# Patient Record
Sex: Female | Born: 1975 | Race: Black or African American | Hispanic: No | Smoking: Never smoker
Health system: Southern US, Community
[De-identification: ages and names within clinical notes are randomized; demographics above are authoritative.]

---

## 1998-02-07 ENCOUNTER — Inpatient Hospital Stay (HOSPITAL_COMMUNITY): Admission: RE | Admit: 1998-02-07 | Discharge: 1998-02-07 | Payer: Self-pay | Admitting: Obstetrics & Gynecology

## 1998-02-07 ENCOUNTER — Inpatient Hospital Stay (HOSPITAL_COMMUNITY): Admission: AD | Admit: 1998-02-07 | Discharge: 1998-02-07 | Payer: Self-pay

## 1998-03-10 ENCOUNTER — Inpatient Hospital Stay (HOSPITAL_COMMUNITY): Admission: AD | Admit: 1998-03-10 | Discharge: 1998-03-10 | Payer: Self-pay | Admitting: Obstetrics

## 1998-03-13 ENCOUNTER — Ambulatory Visit (HOSPITAL_COMMUNITY): Admission: RE | Admit: 1998-03-13 | Discharge: 1998-03-13 | Payer: Self-pay | Admitting: Obstetrics

## 1998-03-13 ENCOUNTER — Other Ambulatory Visit: Admission: RE | Admit: 1998-03-13 | Discharge: 1998-03-13 | Payer: Self-pay | Admitting: Obstetrics

## 1998-04-09 ENCOUNTER — Inpatient Hospital Stay (HOSPITAL_COMMUNITY): Admission: AD | Admit: 1998-04-09 | Discharge: 1998-04-10 | Payer: Self-pay | Admitting: Obstetrics

## 1998-04-09 ENCOUNTER — Encounter: Payer: Self-pay | Admitting: Obstetrics

## 1998-04-12 ENCOUNTER — Other Ambulatory Visit: Admission: RE | Admit: 1998-04-12 | Discharge: 1998-04-12 | Payer: Self-pay | Admitting: Obstetrics

## 1998-07-31 ENCOUNTER — Inpatient Hospital Stay (HOSPITAL_COMMUNITY): Admission: AD | Admit: 1998-07-31 | Discharge: 1998-07-31 | Payer: Self-pay | Admitting: Obstetrics

## 1998-08-28 ENCOUNTER — Inpatient Hospital Stay (HOSPITAL_COMMUNITY): Admission: AD | Admit: 1998-08-28 | Discharge: 1998-08-28 | Payer: Self-pay | Admitting: Obstetrics

## 1998-09-15 ENCOUNTER — Inpatient Hospital Stay (HOSPITAL_COMMUNITY): Admission: AD | Admit: 1998-09-15 | Discharge: 1998-09-17 | Payer: Self-pay | Admitting: Obstetrics

## 1999-06-11 ENCOUNTER — Other Ambulatory Visit: Admission: RE | Admit: 1999-06-11 | Discharge: 1999-06-11 | Payer: Self-pay | Admitting: Obstetrics and Gynecology

## 1999-07-13 ENCOUNTER — Encounter (INDEPENDENT_AMBULATORY_CARE_PROVIDER_SITE_OTHER): Payer: Self-pay | Admitting: Specialist

## 1999-07-13 ENCOUNTER — Ambulatory Visit (HOSPITAL_COMMUNITY): Admission: RE | Admit: 1999-07-13 | Discharge: 1999-07-13 | Payer: Self-pay | Admitting: Obstetrics and Gynecology

## 1999-08-17 ENCOUNTER — Emergency Department (HOSPITAL_COMMUNITY): Admission: EM | Admit: 1999-08-17 | Discharge: 1999-08-18 | Payer: Self-pay | Admitting: Emergency Medicine

## 1999-08-18 ENCOUNTER — Encounter: Payer: Self-pay | Admitting: Emergency Medicine

## 2000-06-25 ENCOUNTER — Other Ambulatory Visit: Admission: RE | Admit: 2000-06-25 | Discharge: 2000-06-25 | Payer: Self-pay | Admitting: Obstetrics and Gynecology

## 2000-08-12 ENCOUNTER — Encounter (INDEPENDENT_AMBULATORY_CARE_PROVIDER_SITE_OTHER): Payer: Self-pay

## 2000-08-12 ENCOUNTER — Other Ambulatory Visit: Admission: RE | Admit: 2000-08-12 | Discharge: 2000-08-12 | Payer: Self-pay | Admitting: Obstetrics and Gynecology

## 2001-01-09 ENCOUNTER — Other Ambulatory Visit: Admission: RE | Admit: 2001-01-09 | Discharge: 2001-01-09 | Payer: Self-pay | Admitting: Obstetrics and Gynecology

## 2003-06-13 ENCOUNTER — Other Ambulatory Visit: Admission: RE | Admit: 2003-06-13 | Discharge: 2003-06-13 | Payer: Self-pay | Admitting: Obstetrics and Gynecology

## 2004-03-26 ENCOUNTER — Other Ambulatory Visit: Admission: RE | Admit: 2004-03-26 | Discharge: 2004-03-26 | Payer: Self-pay | Admitting: Obstetrics and Gynecology

## 2005-05-27 ENCOUNTER — Other Ambulatory Visit: Admission: RE | Admit: 2005-05-27 | Discharge: 2005-05-27 | Payer: Self-pay | Admitting: Obstetrics and Gynecology

## 2006-09-05 ENCOUNTER — Ambulatory Visit (HOSPITAL_COMMUNITY): Admission: RE | Admit: 2006-09-05 | Discharge: 2006-09-05 | Payer: Self-pay | Admitting: Chiropractic Medicine

## 2008-12-05 ENCOUNTER — Encounter: Admission: RE | Admit: 2008-12-05 | Discharge: 2008-12-05 | Payer: Self-pay | Admitting: Family Medicine

## 2010-06-08 NOTE — Op Note (Signed)
[facility]  Patient:    [name], [name]                     MRN: [id number] Proc. Date: 07/13/99 Adm. Date:  [id number] Disc. Date: [id number] Attending:  Michaele Offer                           Operative Report  PREOPERATIVE DIAGNOSIS:  Lesion on the mons pubis.  POSTOPERATIVE DIAGNOSIS:  Lesion on the mons pubis.  PROCEDURE:  Removal of the lesion.  SURGEON:  Lavina Hamman, M.D.  ANESTHESIA:  Local with 8 cc of 0.25% Marcaine with epinephrine.  DESCRIPTION OF PROCEDURE:  After appropriate informed consent was obtained, the patient was taken to the minor procedure room and placed in the dorsal supine position. The area around this 2 cm polypoid lesion was prepped with Betadine. The skin beneath the lesion was then infiltrated with 0.25% Marcaine with epinephrine. Anesthesia was found to be adequate and the lesion was removed sharply with a knife. The small skin defect was reapproximated with two figure-of-eight sutures of 3-0 Vicryl to achieve adequate hemostasis. The patient tolerated the procedure well and was sent home in stable condition. DD: 07/13/99 TD:  07/16/99 Job: [id number]

## 2012-07-31 ENCOUNTER — Ambulatory Visit
Admission: RE | Admit: 2012-07-31 | Discharge: 2012-07-31 | Disposition: A | Discharge status: Home / Self Care | Payer: BC Managed Care – PPO | Source: Ambulatory Visit | Attending: Family Medicine | Admitting: Family Medicine

## 2012-07-31 ENCOUNTER — Other Ambulatory Visit: Payer: Self-pay | Admitting: Family Medicine

## 2012-07-31 DIAGNOSIS — M549 Dorsalgia, unspecified: Secondary | ICD-10-CM

## 2012-07-31 DIAGNOSIS — M25512 Pain in left shoulder: Secondary | ICD-10-CM

## 2012-07-31 DIAGNOSIS — M25552 Pain in left hip: Secondary | ICD-10-CM

## 2015-12-04 ENCOUNTER — Ambulatory Visit: Payer: BC Managed Care – PPO | Admitting: Nurse Practitioner

## 2017-12-16 ENCOUNTER — Encounter (INDEPENDENT_AMBULATORY_CARE_PROVIDER_SITE_OTHER): Payer: Self-pay | Admitting: Orthopaedic Surgery

## 2017-12-16 ENCOUNTER — Ambulatory Visit (INDEPENDENT_AMBULATORY_CARE_PROVIDER_SITE_OTHER): Payer: Worker's Compensation | Admitting: Orthopaedic Surgery

## 2017-12-16 VITALS — BP 132/83 | HR 98 | Ht 66.0 in | Wt 157.0 lb

## 2017-12-16 DIAGNOSIS — S20222A Contusion of left back wall of thorax, initial encounter: Secondary | ICD-10-CM | POA: Insufficient documentation

## 2017-12-16 NOTE — Progress Notes (Signed)
Office Visit Note/orthopedic consultation requested by Dr. Azucena CecilSwayne   Patient: Samantha Weber           Date of Birth: 10-28-1975           MRN: [id number] Visit Date: 12/16/2017              Requested by: No referring provider defined for this encounter. PCP: Patient, No Pcp Per   Assessment & Plan: Visit Diagnoses:  1. Contusion of left back wall of thorax, initial encounter     Plan: I reviewed the MRI scan I gave her a copy of the report.  We will outline a gradual ramp up program having her do 1 hour regular duty in 3 hours of light duty and then each subsequent 2 weeks increase her regular duty by 1 hour.  I plan to recheck her in 2 months.  Thank you for the opportunity to see her in consultation  Follow-Up Instructions: Return in about 2 months (around 02/15/2018).   Orders:  No orders of the defined types were placed in this encounter.  No orders of the defined types were placed in this encounter.     Procedures: No procedures performed   Clinical Data: No additional findings.   Subjective: Chief Complaint  Patient presents with  . Spine - Pain    OTJI 08/25/17    HPI orthopedic consultation for an on-the-job injury that occurred on 08/25/2017 requested by Dr. Azucena CecilSwayne, [facility] [place] [facility] 42 year old female works at [employer] and a 70 pound headboard fell off a conveyor belt above her and landed on her back in the mid thoracic region.  She is had an MRI scan which is available for review both thoracic spine and also lumbar spine.  She tried doing her regular job after being on modified light duty and states after about an hour and a half she had some increased pain.  She does some turning and twisting when she works but normally does not do heavy lifting repetitively.  She states the lifting limit for her is 50 pounds.  She been on some muscle relaxants.  [facility] and are available for review.  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  Review of Systems patient review of systems positive for acid reflux she is on no other medication she is been active healthy.  She has a 43 year old child is healthy.  14 point review of systems otherwise negative   Objective: Vital Signs: BP 132/83   Pulse 98   Ht 5\' 6"  (1.676 m)   Wt 157 lb (71.2 kg)   BMI 25.34 kg/m   Physical Exam  Constitutional: She is oriented to person, place, and time. She appears well-developed.  HENT:  Head: Normocephalic.  Right Ear: External ear normal.  Left Ear: External ear normal.  Eyes: Pupils are equal, round, and reactive to light.  Neck: No tracheal deviation present. No thyromegaly present.  Cardiovascular: Normal rate.  Pulmonary/Chest: Effort normal.  Abdominal: Soft.  Neurological: She is alert and oriented to person, place, and time.  Skin: Skin is warm and dry.  Psychiatric: She has a normal mood and affect. Her behavior is normal.    Ortho Exam patient has an intact strength good forward flexion some discomfort with extension and twisting.  Some tenderness mid to upper thoracic more left parathoracic.  No  winging of the scapula.  Good lower extremity strength.  She can heel toe walk easily.  Specialty Comments:  No specialty comments available.  Imaging: MRI scans were reviewed with the CD disc thoracic and lumbar MRI reviewed these with the patient these are entirely normal without evidence of acute injury normal disc hydration good position and alignment.   PMFS History: There are no active problems to display for this patient.  History reviewed. No pertinent past medical history.  No family history on file.  History reviewed. No pertinent surgical history. Social History   Occupational History  . Not on file  Tobacco Use  . Smoking status: Never Smoker  . Smokeless tobacco: Never Used  Substance and  Sexual Activity  . Alcohol use: Yes    Comment: occasionally  . Drug use: Not on file  . Sexual activity: Not on file

## 2017-12-22 ENCOUNTER — Telehealth (INDEPENDENT_AMBULATORY_CARE_PROVIDER_SITE_OTHER): Payer: Self-pay | Admitting: Orthopaedic Surgery

## 2017-12-22 NOTE — Telephone Encounter (Signed)
[facility]16/[facility] [phone]

## 2017-12-23 ENCOUNTER — Telehealth (INDEPENDENT_AMBULATORY_CARE_PROVIDER_SITE_OTHER): Payer: Self-pay

## 2017-12-23 NOTE — Telephone Encounter (Signed)
Faxed 12/16/17 office and work note to [employer] @ [place] [phone]

## 2017-12-23 NOTE — Telephone Encounter (Signed)
-----   Message from Eldred MangesMark C Yates, MD sent at 12/16/2017  3:03 PM EST ----- Cc wc And cc  Of Consult to Dr. Azucena CecilSwayne thanks

## 2018-02-17 ENCOUNTER — Encounter (INDEPENDENT_AMBULATORY_CARE_PROVIDER_SITE_OTHER): Payer: Self-pay | Admitting: Orthopaedic Surgery

## 2018-02-17 ENCOUNTER — Ambulatory Visit (INDEPENDENT_AMBULATORY_CARE_PROVIDER_SITE_OTHER): Payer: Worker's Compensation | Admitting: Orthopaedic Surgery

## 2018-02-17 VITALS — BP 116/75 | HR 81 | Ht 66.0 in | Wt 150.0 lb

## 2018-02-17 DIAGNOSIS — S20222D Contusion of left back wall of thorax, subsequent encounter: Secondary | ICD-10-CM

## 2018-02-18 ENCOUNTER — Telehealth (INDEPENDENT_AMBULATORY_CARE_PROVIDER_SITE_OTHER): Payer: Self-pay

## 2018-02-18 ENCOUNTER — Encounter (INDEPENDENT_AMBULATORY_CARE_PROVIDER_SITE_OTHER): Payer: Self-pay | Admitting: Orthopaedic Surgery

## 2018-02-18 NOTE — Telephone Encounter (Signed)
Faxed office note to [facility] [phone]

## 2018-02-18 NOTE — Progress Notes (Signed)
Office Visit Note   Patient: Samantha Weber           Date of Birth: 1975-03-28           MRN: [id number] Visit Date: 02/17/2018              Requested by: No referring provider defined for this encounter. PCP: Patient, No Pcp Per   Assessment & Plan: Visit Diagnoses:  1. Contusion of left back wall of thorax, subsequent encounter     Plan: Patient's had MRI scan thoracic and lumbar which are normal.  She is having problems sleeping and we will restart some Flexeril 10 mg at night.  We discussed the try cyclic effect of Flexeril which may help her rest as well as improve her pain symptoms.  I plan to recheck her in 2 months.  Follow-Up Instructions: No follow-ups on file.   Orders:  No orders of the defined types were placed in this encounter.  No orders of the defined types were placed in this encounter.     Procedures: No procedures performed   Clinical Data: No additional findings.   Subjective: Chief Complaint  Patient presents with  . Lower Back - Pain, Follow-up    OTJI 08/25/17  . Middle Back - Pain, Follow-up    HPI 43 year old female seen for follow-up with on-the-job injury from 08/25/2017 with continued symptoms in the thoracic region between the scapula and at the thoracolumbar junction.  She states she has discomfort when she sits back against a chair.  She denies any lower extremity numbness or tingling she is taken ibuprofen without relief.  She is having problems sleeping.  She works at [employer] 4 hours part-time work and also is a Merchandiser, retail.  No fever chills.  No numbness or tingling in her hands.  Review of Systems reviewed updated unchanged from 12/16/2017 note is a pertains HPI 14 point update.   Objective: Vital Signs: BP 116/75   Pulse 81   Ht 5\' 6"  (1.676 m)   Wt 150 lb (68 kg)   BMI 24.21 kg/m   Physical Exam Constitutional:      Appearance: She is well-developed.  HENT:     Head: Normocephalic.     Right Ear:  External ear normal.     Left Ear: External ear normal.  Eyes:     Pupils: Pupils are equal, round, and reactive to light.  Neck:     Thyroid: No thyromegaly.     Trachea: No tracheal deviation.  Cardiovascular:     Rate and Rhythm: Normal rate.  Pulmonary:     Effort: Pulmonary effort is normal.  Abdominal:     Palpations: Abdomen is soft.  Skin:    General: Skin is warm and dry.  Neurological:     Mental Status: She is alert and oriented to person, place, and time.  Psychiatric:        Behavior: Behavior normal.     Ortho Exam patient has normal heel toe gait.  Some tenderness around the upper thoracic paraspinal muscles.  No scapular winging.  No rash over exposed skin section the hands are intact.  She is able heel and toe walk.  Specialty Comments:  No specialty comments available.  Imaging: No results found.   PMFS History: Patient Active Problem List   Diagnosis Date Noted  . Contusion of left back wall of thorax 12/16/2017   No past medical history on file.  No family history on file.  No past surgical history on file. Social History   Occupational History  . Not on file  Tobacco Use  . Smoking status: Never Smoker  . Smokeless tobacco: Never Used  Substance and Sexual Activity  . Alcohol use: Yes    Comment: occasionally  . Drug use: Not on file  . Sexual activity: Not on file

## 2018-02-18 NOTE — Telephone Encounter (Signed)
-----   Message from Eldred Manges, MD sent at 02/18/2018  8:11 AM EST ----- W/c thx

## 2018-11-24 ENCOUNTER — Other Ambulatory Visit (INDEPENDENT_AMBULATORY_CARE_PROVIDER_SITE_OTHER): Payer: Self-pay | Admitting: Orthopaedic Surgery

## 2018-11-24 NOTE — Telephone Encounter (Signed)
Refill request received on 11/04/2018 but not linked to correct patient. Patient has not been seen in the office since January of this year. Would you like for me to make return office appt prior to refill?

## 2018-11-24 NOTE — Telephone Encounter (Signed)
Yes ROV thanks

## 2018-12-29 ENCOUNTER — Other Ambulatory Visit: Payer: Self-pay

## 2018-12-29 ENCOUNTER — Other Ambulatory Visit: Payer: Self-pay | Admitting: Family Medicine

## 2018-12-29 ENCOUNTER — Ambulatory Visit: Payer: Self-pay

## 2018-12-29 DIAGNOSIS — M79671 Pain in right foot: Secondary | ICD-10-CM

## 2019-03-09 ENCOUNTER — Emergency Department (HOSPITAL_COMMUNITY): Payer: BC Managed Care – PPO

## 2019-03-09 ENCOUNTER — Emergency Department (HOSPITAL_COMMUNITY)
Admission: EM | Admit: 2019-03-09 | Discharge: 2019-03-09 | Disposition: A | Discharge status: Home / Self Care | Payer: BC Managed Care – PPO | Attending: Emergency Medicine | Admitting: Emergency Medicine

## 2019-03-09 ENCOUNTER — Other Ambulatory Visit: Payer: Self-pay

## 2019-03-09 ENCOUNTER — Encounter (HOSPITAL_COMMUNITY): Payer: Self-pay | Admitting: Emergency Medicine

## 2019-03-09 DIAGNOSIS — Z79899 Other long term (current) drug therapy: Secondary | ICD-10-CM | POA: Diagnosis not present

## 2019-03-09 DIAGNOSIS — Z20822 Contact with and (suspected) exposure to covid-19: Secondary | ICD-10-CM | POA: Insufficient documentation

## 2019-03-09 DIAGNOSIS — R55 Syncope and collapse: Secondary | ICD-10-CM | POA: Diagnosis present

## 2019-03-09 DIAGNOSIS — R1031 Right lower quadrant pain: Secondary | ICD-10-CM | POA: Insufficient documentation

## 2019-03-09 DIAGNOSIS — B349 Viral infection, unspecified: Secondary | ICD-10-CM

## 2019-03-09 LAB — COMPREHENSIVE METABOLIC PANEL
ALT: 19 U/L (ref 0–44)
AST: 19 U/L (ref 15–41)
Albumin: 4.3 g/dL (ref 3.5–5.0)
Alkaline Phosphatase: 67 U/L (ref 38–126)
Anion gap: 12 (ref 5–15)
BUN: 13 mg/dL (ref 6–20)
CO2: 25 mmol/L (ref 22–32)
Calcium: 9.5 mg/dL (ref 8.9–10.3)
Chloride: 104 mmol/L (ref 98–111)
Creatinine, Ser: 0.96 mg/dL (ref 0.44–1.00)
GFR calc Af Amer: >60 mL/min (ref >60)
GFR calc non Af Amer: >60 mL/min (ref >60)
Glucose, Bld: 102 mg/dL — ABNORMAL HIGH (ref 70–99)
Potassium: 4 mmol/L (ref 3.5–5.1)
Sodium: 141 mmol/L (ref 135–145)
Total Bilirubin: 1.4 mg/dL — ABNORMAL HIGH (ref 0.3–1.2)
Total Protein: 7.9 g/dL (ref 6.5–8.1)

## 2019-03-09 LAB — URINALYSIS, ROUTINE W REFLEX MICROSCOPIC
Bilirubin Urine: NEGATIVE
Glucose, UA: NEGATIVE mg/dL
Hgb urine dipstick: NEGATIVE
Ketones, ur: 20 mg/dL — AB
Nitrite: NEGATIVE
Protein, ur: NEGATIVE mg/dL
Specific Gravity, Urine: 1.015 (ref 1.005–1.030)
pH: 7 (ref 5.0–8.0)

## 2019-03-09 LAB — CBC WITH DIFFERENTIAL/PLATELET
Abs Immature Granulocytes: 0.01 10*3/uL (ref 0.00–0.07)
Basophils Absolute: 0 10*3/uL (ref 0.0–0.1)
Basophils Relative: 1 %
Eosinophils Absolute: 0 10*3/uL (ref 0.0–0.5)
Eosinophils Relative: 0 %
HCT: 41.3 % (ref 36.0–46.0)
Hemoglobin: 14.5 g/dL (ref 12.0–15.0)
Immature Granulocytes: 0 %
Lymphocytes Relative: 31 %
Lymphs Abs: 2.1 10*3/uL (ref 0.7–4.0)
MCH: 32.7 pg (ref 26.0–34.0)
MCHC: 35.1 g/dL (ref 30.0–36.0)
MCV: 93 fL (ref 80.0–100.0)
Monocytes Absolute: 0.6 10*3/uL (ref 0.1–1.0)
Monocytes Relative: 9 %
Neutro Abs: 4.1 10*3/uL (ref 1.7–7.7)
Neutrophils Relative %: 59 %
Platelets: 361 10*3/uL (ref 150–400)
RBC: 4.44 MIL/uL (ref 3.87–5.11)
RDW: 12.5 % (ref 11.5–15.5)
WBC: 6.9 10*3/uL (ref 4.0–10.5)
nRBC: 0 % (ref 0.0–0.2)

## 2019-03-09 LAB — SARS CORONAVIRUS 2 BY RT PCR (DIASORIN): SARS Coronavirus 2: NEGATIVE

## 2019-03-09 LAB — PREGNANCY, URINE: Preg Test, Ur: NEGATIVE

## 2019-03-09 LAB — CBG MONITORING, ED: Glucose-Capillary: 94 mg/dL (ref 70–99)

## 2019-03-09 LAB — LIPASE, BLOOD: Lipase: 27 U/L (ref 11–51)

## 2019-03-09 LAB — POC SARS CORONAVIRUS 2 AG -  ED: SARS Coronavirus 2 Ag: NEGATIVE

## 2019-03-09 MED ORDER — SODIUM CHLORIDE 0.9 % IV BOLUS
1000.0000 mL | Freq: Once | INTRAVENOUS | Status: AC
  Administered 2019-03-09: 1000 mL via INTRAVENOUS

## 2019-03-09 MED ORDER — IOHEXOL 350 MG/ML SOLN
100.0000 mL | Freq: Once | INTRAVENOUS | Status: AC | PRN
  Administered 2019-03-09: 13:00:00 100 mL via INTRAVENOUS

## 2019-03-09 MED ORDER — ONDANSETRON 4 MG PO TBDP: ORAL_TABLET | ORAL | 0 refills | Status: AC

## 2019-03-09 NOTE — ED Notes (Signed)
Pt ambulated to bathroom, no assistance needed, just walked with the pt. Steady gait.

## 2019-03-09 NOTE — ED Provider Notes (Signed)
[facility] EMERGENCY DEPARTMENT Provider Note   CSN: [id number] Arrival date & time: 03/09/19  2876     History No chief complaint on file.   Samantha Weber is a 44 y.o. female.  Patient is a 44 year old female who presents after syncopal event.  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  She fell backwards striking her head.  She complains of a significant posterior headache.  She says she has not felt well in the last couple days.  She has had a headache off and on for about a week which she describes as bifrontal type headache.  She has had a little bit of rhinorrhea.  She says that 2 days ago she started having some watery diarrhea which is continued.  She has had few episodes of associated vomiting associated with nausea.  She has some pain in her abdomen, more in the right lower abdomen.  She denies any known fevers.  No cough or chest congestion.  No urinary symptoms.  She denies any injuries from the fall other than the headache.  She denies any neck or back pain.  No numbness or weakness to her extremities other than her hands a felt tingly since she has had all the diarrhea and vomiting.        History reviewed. No pertinent past medical history.  Patient Active Problem List   Diagnosis Date Noted  . Contusion of left back wall of thorax 12/16/2017    History reviewed. No pertinent surgical history.   OB History   No obstetric history on file.     No family history on file.  Social History   Tobacco Use  . Smoking status: Never Smoker  . Smokeless tobacco: Never Used  Substance Use Topics  . Alcohol use: Yes    Comment: occasionally  . Drug use: Not on file    Home Medications Prior to Admission medications   Medication Sig Start Date End Date Taking? Authorizing Provider  medroxyPROGESTERone Acetate 150 MG/ML SUSY Inject 1 mL into the muscle  every 3 (three) months. 02/08/19  Yes [provider]  zolpidem (AMBIEN) 10 MG tablet Take 10 mg by mouth daily as needed for sleep. 02/25/19  Yes [provider]  metroNIDAZOLE (FLAGYL) 500 MG tablet Take 500 mg by mouth 2 (two) times daily. 03/05/19   [provider]  ondansetron (ZOFRAN ODT) 4 MG disintegrating tablet 4mg  ODT q4 hours prn nausea/vomit 03/09/19   03/11/19, MD    Allergies    Erythromycin base  Review of Systems   Review of Systems  Constitutional: Positive for fatigue. Negative for chills, diaphoresis and fever.  HENT: Positive for congestion and rhinorrhea. Negative for sneezing.   Eyes: Negative.   Respiratory: Negative for cough, chest tightness and shortness of breath.   Cardiovascular: Negative for chest pain and leg swelling.  Gastrointestinal: Positive for diarrhea, nausea and vomiting. Negative for abdominal pain and blood in stool.  Genitourinary: Negative for difficulty urinating, flank pain, frequency and hematuria.  Musculoskeletal: Negative for arthralgias and back pain.  Skin: Negative for rash.  Neurological: Positive for syncope, light-headedness and headaches. Negative for speech difficulty, weakness and numbness.    Physical Exam Updated Vital Signs BP 116/76   Pulse 88   Temp 98.7 F (37.1 C) (Oral)   Resp 11   Ht 5' 6.5" (1.689 m)   Wt 71.2  kg   SpO2 100%   BMI 24.96 kg/m   Physical Exam Constitutional:      Appearance: She is well-developed.  HENT:     Head: Normocephalic and atraumatic.  Eyes:     Pupils: Pupils are equal, round, and reactive to light.  Neck:     Comments: No pain along the cervical, thoracic or lumbosacral spine Cardiovascular:     Rate and Rhythm: Normal rate and regular rhythm.     Heart sounds: Normal heart sounds.  Pulmonary:     Effort: Pulmonary effort is normal. No respiratory distress.     Breath sounds: Normal breath sounds. No wheezing or rales.  Chest:     Chest wall:  No tenderness.  Abdominal:     General: Bowel sounds are normal.     Palpations: Abdomen is soft.     Tenderness: There is abdominal tenderness (RLQ, right flank). There is no guarding or rebound.  Musculoskeletal:        General: Normal range of motion.     Cervical back: Normal range of motion and neck supple.  Lymphadenopathy:     Cervical: No cervical adenopathy.  Skin:    General: Skin is warm and dry.     Findings: No rash.  Neurological:     General: No focal deficit present.     Mental Status: She is alert and oriented to person, place, and time.     ED Results / Procedures / Treatments   Labs (all labs ordered are listed, but only abnormal results are displayed) Labs Reviewed  COMPREHENSIVE METABOLIC PANEL - Abnormal; Notable for the following components:      Result Value   Glucose, Bld 102 (*)    Total Bilirubin 1.4 (*)    All other components within normal limits  URINALYSIS, ROUTINE W REFLEX MICROSCOPIC - Abnormal; Notable for the following components:   Ketones, ur 20 (*)    Leukocytes,Ua SMALL (*)    Bacteria, UA FEW (*)    All other components within normal limits  SARS CORONAVIRUS 2 BY RT PCR (DIASORIN)  LIPASE, BLOOD  CBC WITH DIFFERENTIAL/PLATELET  PREGNANCY, URINE  CBG MONITORING, ED  POC SARS CORONAVIRUS 2 AG -  ED    EKG EKG Interpretation  Date/Time:  Tuesday March 09 2019 08:10:21 EST Ventricular Rate:  81 PR Interval:    QRS Duration: 71 QT Interval:  354 QTC Calculation: 411 R Axis:   61 Text Interpretation: Sinus rhythm No old tracing to compare Confirmed by Rolan Bucco [phone]) on 03/09/2019 8:16:32 AM   Radiology CT Head Wo Contrast  Result Date: 03/09/2019 CLINICAL DATA:  Head trauma, unwitnessed syncopal episode EXAM: CT HEAD WITHOUT CONTRAST TECHNIQUE: Contiguous axial images were obtained from the base of the skull through the vertex without intravenous contrast. COMPARISON:  None. FINDINGS: Brain: No evidence of acute  infarction, hemorrhage, hydrocephalus, extra-axial collection or mass lesion/mass effect. Vascular: No hyperdense vessel or unexpected calcification. Skull: No osseous abnormality. Sinuses/Orbits: Visualized paranasal sinuses are clear. Visualized mastoid sinuses are clear. Visualized orbits demonstrate no focal abnormality. Other: None IMPRESSION: No acute intracranial pathology. Electronically Signed   By: Elige Ko   On: 03/09/2019 12:39   CT Abdomen Pelvis W Contrast  Addendum Date: 03/09/2019   ADDENDUM REPORT: 03/09/2019 12:54 ADDENDUM: This addendum is given for the purpose of noting the patient has a small hiatal hernia. Electronically Signed   By: Drusilla Kanner M.D.   On: 03/09/2019 12:54   Result Date:  03/09/2019 CLINICAL DATA:  Possible syncopal episode today. Abdominal pain and vomiting for the past 2-3 days. EXAM: CT ABDOMEN AND PELVIS WITH CONTRAST TECHNIQUE: Multidetector CT imaging of the abdomen and pelvis was performed using the standard protocol following bolus administration of intravenous contrast. CONTRAST:  1101mL OMNIPAQUE IOHEXOL 350 MG/ML SOLN COMPARISON:  None. FINDINGS: Lower chest: Mild dependent atelectasis in the lung bases. No pleural or pericardial effusion. Hepatobiliary: No focal liver abnormality is seen. No gallstones, gallbladder wall thickening, or biliary dilatation. Pancreas: Unremarkable. No pancreatic ductal dilatation or surrounding inflammatory changes. Spleen: Normal in size without focal abnormality. Adrenals/Urinary Tract: Adrenal glands are unremarkable. Kidneys are normal, without renal calculi, focal lesion, or hydronephrosis. Bladder is unremarkable. Stomach/Bowel: Stomach is within normal limits. Appendix appears normal. No evidence of bowel wall thickening, distention, or inflammatory changes. Vascular/Lymphatic: No significant vascular findings are present. No enlarged abdominal or pelvic lymph nodes. Reproductive: Uterus and bilateral adnexa are  unremarkable. Other: None. Musculoskeletal: Negative. IMPRESSION: Negative CT abdomen and pelvis. Electronically Signed: By: Inge Rise M.D. On: 03/09/2019 12:42    Procedures Procedures (including critical care time)  Medications Ordered in ED Medications  sodium chloride 0.9 % bolus 1,000 mL (0 mLs Intravenous Stopped 03/09/19 1247)  iohexol (OMNIPAQUE) 350 MG/ML injection 100 mL (100 mLs Intravenous Contrast Given 03/09/19 1231)    ED Course  I have reviewed the triage vital signs and the nursing notes.  Pertinent labs & imaging results that were available during my care of the patient were reviewed by me and considered in my medical decision making (see chart for details).    MDM Rules/Calculators/A&P                     Patient is a 44 year old female who presents after a syncopal episode.  She has had viral-like symptoms with diarrhea nausea vomiting and some rhinorrhea for the last several days.  She did have some abdominal tenderness so a CT scan was performed which showed no acute abnormalities.  Her labs are nonconcerning.  She also had a headache from hitting her posterior head from falling.  She had no associated neck or back pain.  Her CT scan shows no acute abnormalities.  She is neurologically intact.  She is feeling much better after IV fluids in the ED.  She is able to tolerate oral fluids without difficulty.  She is able to ambulate without dizziness or ataxia.  She was discharged home in good condition.  Symptomatic care instructions were given.  Her initial Covid test was negative but a PCR is pending.  Return precautions were given.  ALAYHA BABINEAUX was evaluated in Emergency Department on 03/09/2019 for the symptoms described in the history of present illness. She was evaluated in the context of the global COVID-19 pandemic, which necessitated consideration that the patient might be at risk for infection with the SARS-CoV-2 virus that causes COVID-19. Institutional  protocols and algorithms that pertain to the evaluation of patients at risk for COVID-19 are in a state of rapid change based on information released by regulatory bodies including the CDC and federal and state organizations. These policies and algorithms were followed during the patient's care in the ED.   Final Clinical Impression(s) / ED Diagnoses Final diagnoses:  Vasovagal syncope  Viral syndrome    Rx / DC Orders ED Discharge Orders         Ordered    ondansetron (ZOFRAN ODT) 4 MG disintegrating tablet  03/09/19 1341           Rolan Bucco, MD 03/09/19 1344

## 2019-03-09 NOTE — ED Triage Notes (Signed)
Per [facility] pt coming from work after having unwitnessed possible syncopal event. C/o headache, no neck or back pain. C-collar in place. Adds over the weekend had abdominal pain with emesis. 4mg  Zofran given en route.

## 2019-03-09 NOTE — ED Notes (Signed)
Patient verbalizes understanding of discharge instructions. Opportunity for questioning and answers were provided. Armband removed by staff, pt discharged from ED.  

## 2020-03-01 ENCOUNTER — Other Ambulatory Visit: Payer: Self-pay

## 2020-03-01 ENCOUNTER — Ambulatory Visit (HOSPITAL_COMMUNITY)
Admission: EM | Admit: 2020-03-01 | Discharge: 2020-03-01 | Disposition: A | Discharge status: Home / Self Care | Payer: BC Managed Care – PPO | Attending: Family Medicine | Admitting: Family Medicine

## 2020-03-01 ENCOUNTER — Ambulatory Visit (INDEPENDENT_AMBULATORY_CARE_PROVIDER_SITE_OTHER): Payer: BC Managed Care – PPO

## 2020-03-01 ENCOUNTER — Encounter (HOSPITAL_COMMUNITY): Payer: Self-pay

## 2020-03-01 DIAGNOSIS — U071 COVID-19: Secondary | ICD-10-CM | POA: Diagnosis not present

## 2020-03-01 DIAGNOSIS — R0602 Shortness of breath: Secondary | ICD-10-CM

## 2020-03-01 MED ORDER — BENZONATATE 100 MG PO CAPS: ORAL_CAPSULE | ORAL | 0 refills | Status: AC

## 2020-03-01 NOTE — ED Triage Notes (Signed)
Pt report she had shortness of breath this morning when took his dog for a walk, states shortness of breath improved at this moment. Reports cough and chest tightness when coughing. Pt taking OTC cough medications gives some relief.   Reports tested positive for COVID on 02/26/2020.

## 2020-03-01 NOTE — ED Provider Notes (Signed)
  [facility]   [id number] 03/01/20 Arrival Time: 1147  ASSESSMENT & PLAN:  1. COVID-19 virus infection   2. SOB (shortness of breath)     I have personally viewed the imaging studies ordered this visit. Normal CXR. No signs of PNA. Discussed and reassured.  As needed: Meds ordered this encounter  Medications  . benzonatate (TESSALON) 100 MG capsule    Sig: Take 1 capsule by mouth every 8 (eight) hours for cough.    Dispense:  21 capsule    Refill:  0     Follow-up Information    [facility].   Specialty: Urgent Care Why: If worsening or failing to improve as anticipated. Contact information: [address] [place] [address] [phone]              Reviewed expectations re: course of current medical issues. Questions answered. Outlined signs and symptoms indicating need for more acute intervention. Understanding verbalized. After Visit Summary given.   SUBJECTIVE: History from: patient. Samantha Weber is a 45 y.o. female who tested + for OCVID on 02/26/20. Reports fatigue and feeling SOB with exertion this am while walking dog. Improved with rest. No associated CP/n/v. No current SOB. Afebrile. Coughing is bothering her; preventing sleep.    OBJECTIVE:  Vitals:   03/01/20 1202  BP: 121/75  Pulse: 96  Resp: 20  Temp: 98.8 F (37.1 C)  TempSrc: Oral  SpO2: 96%    General appearance: alert; no distress Eyes: PERRLA; EOMI; conjunctiva normal HENT: [place]; AT; with nasal congestion Neck: supple  Lungs: speaks full sentences without difficulty; unlabored; CTAB Extremities: no edema Skin: warm and dry Neurologic: normal gait Psychological: alert and cooperative; normal mood and affect   Imaging: DG Chest 2 View  Result Date: 03/01/2020 CLINICAL DATA:  COVID-19 positive.  Shortness of breath. EXAM: CHEST - 2 VIEW COMPARISON:  December 05, 2008. FINDINGS: The heart size and mediastinal contours are  within normal limits. Both lungs are clear. No pneumothorax or pleural effusion is noted. The visualized skeletal structures are unremarkable. IMPRESSION: No active cardiopulmonary disease. Electronically Signed   By: Lupita Raider M.D.   On: 03/01/2020 12:44    Allergies  Allergen Reactions  . Erythromycin Base Other (See Comments)    History reviewed. No pertinent past medical history. Social History   Socioeconomic History  . Marital status: Significant Other    Spouse name: Not on file  . Number of children: Not on file  . Years of education: Not on file  . Highest education level: Not on file  Occupational History  . Not on file  Tobacco Use  . Smoking status: Never Smoker  . Smokeless tobacco: Never Used  Substance and Sexual Activity  . Alcohol use: Yes    Comment: occasionally  . Drug use: Not on file  . Sexual activity: Not on file  Other Topics Concern  . Not on file  Social History Narrative  . Not on file   Social Determinants of Health   Financial Resource Strain: Not on file  Food Insecurity: Not on file  Transportation Needs: Not on file  Physical Activity: Not on file  Stress: Not on file  Social Connections: Not on file  Intimate Partner Violence: Not on file   History reviewed. No pertinent family history. History reviewed. No pertinent surgical history.   Mardella Layman, MD 03/01/20 1315

## 2020-03-28 ENCOUNTER — Ambulatory Visit (INDEPENDENT_AMBULATORY_CARE_PROVIDER_SITE_OTHER): Payer: Self-pay

## 2020-03-28 ENCOUNTER — Other Ambulatory Visit: Payer: Self-pay

## 2020-03-28 ENCOUNTER — Encounter (HOSPITAL_COMMUNITY): Payer: Self-pay | Admitting: *Deleted

## 2020-03-28 ENCOUNTER — Ambulatory Visit (HOSPITAL_COMMUNITY)
Admission: EM | Admit: 2020-03-28 | Discharge: 2020-03-28 | Disposition: A | Discharge status: Home / Self Care | Payer: Self-pay | Attending: Urgent Care | Admitting: Urgent Care

## 2020-03-28 DIAGNOSIS — R058 Other specified cough: Secondary | ICD-10-CM

## 2020-03-28 DIAGNOSIS — R062 Wheezing: Secondary | ICD-10-CM

## 2020-03-28 DIAGNOSIS — Z8616 Personal history of COVID-19: Secondary | ICD-10-CM

## 2020-03-28 DIAGNOSIS — R079 Chest pain, unspecified: Secondary | ICD-10-CM

## 2020-03-28 DIAGNOSIS — R0789 Other chest pain: Secondary | ICD-10-CM

## 2020-03-28 MED ORDER — PROMETHAZINE-DM 6.25-15 MG/5ML PO SYRP: 5.0000 mL | ORAL_SOLUTION | Freq: Every evening | ORAL | 0 refills | Status: AC | PRN

## 2020-03-28 MED ORDER — PREDNISONE 20 MG PO TABS: ORAL_TABLET | ORAL | 0 refills | Status: AC

## 2020-03-28 MED ORDER — TIZANIDINE HCL 4 MG PO TABS: 4.0000 mg | ORAL_TABLET | Freq: Every day | ORAL | 0 refills | Status: DC

## 2020-03-28 NOTE — ED Triage Notes (Signed)
Pt reports today when she sneezed a sever pain occurred under her RT breast . Pt felt pain in th Rt back. Pt had COVID one moth ago and continues to have a productive cough .

## 2020-03-28 NOTE — ED Provider Notes (Signed)
[facility]   MRN: [id number] DOB: 11-19-1975  Subjective:   Samantha Weber is a 45 y.o. female presenting for 2-week history of persistent productive cough.  Patient did have COVID-19 a month ago and states that her cough is actually been going on since then.  States that now the cough has started to have pain of the right front part of her ribs that goes to the right side of her thoracic back.  She has used over-the-counter medications, Mucinex, supportive care.  Denies history of lung disorders, asthma.  No smoking.  No current facility-administered medications for this encounter.  Current Outpatient Medications:  .  benzonatate (TESSALON) 100 MG capsule, Take 1 capsule by mouth every 8 (eight) hours for cough., Disp: 21 capsule, Rfl: 0 .  medroxyPROGESTERone (DEPO-PROVERA) 150 MG/ML injection, ADMINISTER 1 ML IN THE MUSCLE EVERY 3 MONTHS, Disp: , Rfl:  .  ondansetron (ZOFRAN ODT) 4 MG disintegrating tablet, 4mg  ODT q4 hours prn nausea/vomit, Disp: 4 tablet, Rfl: 0   Allergies  Allergen Reactions  . Erythromycin Base Other (See Comments)    History reviewed. No pertinent past medical history.   History reviewed. No pertinent surgical history.  History reviewed. No pertinent family history.  Social History   Tobacco Use  . Smoking status: Never Smoker  . Smokeless tobacco: Never Used  Substance Use Topics  . Alcohol use: Yes    Comment: occasionally    ROS   Objective:   Vitals: BP 135/84 (BP Location: Left Arm)   Pulse 85   Temp 98.4 F (36.9 C) (Oral)   Resp 20   SpO2 99%   Physical Exam Constitutional:      General: She is not in acute distress.    Appearance: Normal appearance. She is well-developed. She is not ill-appearing, toxic-appearing or diaphoretic.  HENT:     Head: Normocephalic and atraumatic.     Nose: Nose normal.     Mouth/Throat:     Mouth: Mucous membranes are moist.  Eyes:     Extraocular Movements: Extraocular  movements intact.     Pupils: Pupils are equal, round, and reactive to light.  Cardiovascular:     Rate and Rhythm: Normal rate and regular rhythm.     Pulses: Normal pulses.     Heart sounds: Normal heart sounds. No murmur heard. No friction rub. No gallop.   Pulmonary:     Effort: Pulmonary effort is normal. No respiratory distress.     Breath sounds: Normal breath sounds. No stridor. No wheezing, rhonchi or rales.  Chest:     Chest wall: Tenderness present.    Skin:    General: Skin is warm and dry.     Findings: No rash.  Neurological:     Mental Status: She is alert and oriented to person, place, and time.  Psychiatric:        Mood and Affect: Mood normal.        Behavior: Behavior normal.        Thought Content: Thought content normal.     DG Chest 2 View  Result Date: 03/28/2020 CLINICAL DATA:  Productive cough, wheezing, right-sided chest pain for 2 weeks, COVID 19 positive 1 month ago EXAM: CHEST - 2 VIEW COMPARISON:  03/01/2020 FINDINGS: The heart size and mediastinal contours are within normal limits. Both lungs are clear. The visualized skeletal structures are unremarkable. IMPRESSION: No active cardiopulmonary disease. Electronically Signed   By: 04/29/2020.D.  On: 03/28/2020 17:36    Assessment and Plan :   PDMP not reviewed this encounter.  1. Productive cough   2. Chest wall pain   3. History of COVID-19     Patient is a 45 year old female with no past medical history of lung disorders presenting for an ongoing 1 month history of cough that is now causing chest pain since having COVID-19 a month ago.  We will use an oral prednisone course given the duration of patient's cough and the fact that it is now causing her chest pain.  Recommended supportive care otherwise.  Chest x-ray is otherwise normal and physical exam findings unremarkable. Counseled patient on potential for adverse effects with medications prescribed/recommended today, ER and  return-to-clinic precautions discussed, patient verbalized understanding.    Wallis Bamberg, PA-C 03/28/20 1753

## 2020-04-04 ENCOUNTER — Ambulatory Visit (HOSPITAL_COMMUNITY)
Admission: EM | Admit: 2020-04-04 | Discharge: 2020-04-04 | Disposition: A | Discharge status: Home / Self Care | Payer: BC Managed Care – PPO | Attending: Family Medicine | Admitting: Family Medicine

## 2020-04-04 ENCOUNTER — Other Ambulatory Visit: Payer: Self-pay

## 2020-04-04 ENCOUNTER — Encounter (HOSPITAL_COMMUNITY): Payer: Self-pay | Admitting: Emergency Medicine

## 2020-04-04 DIAGNOSIS — U099 Post covid-19 condition, unspecified: Secondary | ICD-10-CM

## 2020-04-04 DIAGNOSIS — R0789 Other chest pain: Secondary | ICD-10-CM

## 2020-04-04 DIAGNOSIS — R071 Chest pain on breathing: Secondary | ICD-10-CM

## 2020-04-04 DIAGNOSIS — R062 Wheezing: Secondary | ICD-10-CM

## 2020-04-04 MED ORDER — ALBUTEROL SULFATE HFA 108 (90 BASE) MCG/ACT IN AERS
INHALATION_SPRAY | RESPIRATORY_TRACT | Status: AC
  Filled 2020-04-04: qty 6.7

## 2020-04-04 MED ORDER — TIZANIDINE HCL 4 MG PO TABS: 4.0000 mg | ORAL_TABLET | Freq: Every day | ORAL | 0 refills | Status: AC

## 2020-04-04 MED ORDER — ALBUTEROL SULFATE HFA 108 (90 BASE) MCG/ACT IN AERS
2.0000 | INHALATION_SPRAY | Freq: Four times a day (QID) | RESPIRATORY_TRACT | Status: DC | PRN
  Administered 2020-04-04: 2 via RESPIRATORY_TRACT

## 2020-04-04 MED ORDER — DICLOFENAC POTASSIUM 50 MG PO TABS: 50.0000 mg | ORAL_TABLET | Freq: Two times a day (BID) | ORAL | 0 refills | Status: AC

## 2020-04-04 NOTE — ED Provider Notes (Signed)
[facility]    CSN: [id number] Arrival date & time: 04/04/20  1531      History   Chief Complaint Chief Complaint  Patient presents with  . Follow-up    Rib Pain    HPI Samantha Weber is a 45 y.o. female.   HPI Patient presents today for further evaluation of continued right chest wall pain and pain with breathing.  Patient was seen here in clinic on 3 8 placed on a course of prednisone and medication for cough along with a muscle relaxer.  She reports that her chest congestion has resolved however she continues to have residual chest wall pain.  She also endorses some occasional wheezing with activity but overall her respiratory symptoms have greatly improved.  Patient had COVID over a month ago and has continued to experience some residual symptoms.  She has no history of chronic respiratory disease.  She is a non-smoker.  She has continued to take the muscle relaxer however is only able to tolerate at night due to drowsiness.  She is also been applying warm compresses to her chest wall.  Pain is reproducible with pressing on the chest wall.  She also continues to have a nonproductive cough although since her prednisone this is somewhat improved.  She is afebrile.  She is without a primary care provider. History reviewed. No pertinent past medical history.  Patient Active Problem List   Diagnosis Date Noted  . Contusion of left back wall of thorax 12/16/2017    History reviewed. No pertinent surgical history.  OB History   No obstetric history on file.      Home Medications    Prior to Admission medications   Medication Sig Start Date End Date Taking? Authorizing Provider  diclofenac (CATAFLAM) 50 MG tablet Take 1 tablet (50 mg total) by mouth 2 (two) times daily. 04/04/20  Yes Bing Neighbors, FNP  benzonatate (TESSALON) 100 MG capsule Take 1 capsule by mouth every 8 (eight) hours for cough. 03/01/20   Mardella Layman, MD  medroxyPROGESTERone (DEPO-PROVERA)  150 MG/ML injection ADMINISTER 1 ML IN THE MUSCLE EVERY 3 MONTHS 01/04/20   [provider]  ondansetron (ZOFRAN ODT) 4 MG disintegrating tablet 4mg  ODT q4 hours prn nausea/vomit 03/09/19   03/11/19, MD  predniSONE (DELTASONE) 20 MG tablet Take 2 tablets daily with breakfast. 03/28/20   05/28/20, PA-C  promethazine-dextromethorphan (PROMETHAZINE-DM) 6.25-15 MG/5ML syrup Take 5 mLs by mouth at bedtime as needed for cough. 03/28/20   05/28/20, PA-C  tiZANidine (ZANAFLEX) 4 MG tablet Take 1 tablet (4 mg total) by mouth at bedtime. 04/04/20   04/06/20, FNP  zolpidem (AMBIEN) 10 MG tablet Take 10 mg by mouth daily as needed for sleep. 02/25/19 03/01/20  [provider]    Family History History reviewed. No pertinent family history.  Social History Social History   Tobacco Use  . Smoking status: Never Smoker  . Smokeless tobacco: Never Used  Substance Use Topics  . Alcohol use: Yes    Comment: occasionally     Allergies   Erythromycin base   Review of Systems Review of Systems Pertinent negatives listed in HPI   Physical Exam Triage Vital Signs ED Triage Vitals  Enc Vitals Group     BP 04/04/20 1618 (!) 137/106     Pulse Rate 04/04/20 1618 74     Resp 04/04/20 1618 18     Temp 04/04/20 1618 99 F (37.2 C)  Temp Source 04/04/20 1618 Oral     SpO2 04/04/20 1618 99 %     Weight --      Height --      Head Circumference --      Peak Flow --      Pain Score 04/04/20 1615 5     Pain Loc --      Pain Edu? --      Excl. in GC? --    No data found.  Updated Vital Signs BP (!) 137/106 (BP Location: Right Arm)   Pulse 74   Temp 99 F (37.2 C) (Oral)   Resp 18   SpO2 99%   Visual Acuity Right Eye Distance:   Left Eye Distance:   Bilateral Distance:    Right Eye Near:   Left Eye Near:    Bilateral Near:     Physical Exam Constitutional:      Appearance: Normal appearance. She is not ill-appearing.  Cardiovascular:     Rate and  Rhythm: Normal rate and regular rhythm.  Chest:    Musculoskeletal:     Cervical back: Normal range of motion and neck supple.  Neurological:     Mental Status: She is alert. Mental status is at baseline.     Motor: Motor function is intact.     Coordination: Coordination is intact.     Gait: Gait is intact.  Psychiatric:        Attention and Perception: Attention normal.        Mood and Affect: Mood normal.        Speech: Speech normal.        Behavior: Behavior is cooperative.      UC Treatments / Results  Labs (all labs ordered are listed, but only abnormal results are displayed) Labs Reviewed - No data to display  EKG   Radiology No results found.  Procedures Procedures (including critical care time)  Medications Ordered in UC Medications  albuterol (VENTOLIN HFA) 108 (90 Base) MCG/ACT inhaler 2 puff (2 puffs Inhalation Given 04/04/20 1701)    Initial Impression / Assessment and Plan / UC Course  I have reviewed the triage vital signs and the nursing notes.  Pertinent labs & imaging results that were available during my care of the patient were reviewed by me and considered in my medical decision making (see chart for details).     Patient returns to clinic for symptoms persisting following COVID-19 infection in early February.  Patient was seen in clinic on 03/28/2020 with complaints of productive cough and right sided chest wall pain.  Chest x-ray at that time was negative therefore did not repeat today.  Patient completed a course of prednisone without resolution of symptoms.  Overall lung exam unremarkable and pain is reproducible breathing activity and with palpation therefore no concern from a cardiac etiology.  Patient also complained of wheezing and pain with deep breathing which is consistent with that of muscular chest wall related pain.  Will place on a high dose anti-inflammatory as patient has completed a course of prednisone will defer repeating due to  concern for possible steroid-induced hyperglycemia.  Will trial diclofenac 50 mg twice daily as needed for chest wall pain, tizanidine for acute chest wall pain and for wheezing or shortness of breath albuterol inhaler 2 puffs every 4-6 hours.  Patient given contact information to get established with primary care.  Return precautions given/ ER precautions given. Patient verbalized understanding and agreement with today's plan.  Final  Clinical Impressions(s) / UC Diagnoses   Final diagnoses:  Right-sided chest wall pain  Post-COVID syndrome  Chest pain varying with breathing  Wheezing     Discharge Instructions     Establish with primary care for ongoing management of chest wall pain related to COVID. This pain may linger for anywhere from 4-6 weeks.   Continue with current pain management with tizanidine and I've added Diclofenac 50 mg twice daily as needed. I have provided a list of primary care providers that are accepting new patients please contact either office and schedule the next available appointment with any primary care provider that is accepting new patients.  For wheezing I prescribed albuterol inhaler 2 puffs every 4-6 hours as needed for wheezing, chest pain with breathing, or chest tightness.   ED Prescriptions    Medication Sig Dispense Auth. Provider   diclofenac (CATAFLAM) 50 MG tablet Take 1 tablet (50 mg total) by mouth 2 (two) times daily. 60 tablet Bing Neighbors, FNP   tiZANidine (ZANAFLEX) 4 MG tablet Take 1 tablet (4 mg total) by mouth at bedtime. 30 tablet Bing Neighbors, FNP     PDMP not reviewed this encounter.   Bing Neighbors, FNP 04/08/20 1021

## 2020-04-04 NOTE — Discharge Instructions (Addendum)
Establish with primary care for ongoing management of chest wall pain related to COVID. This pain may linger for anywhere from 4-6 weeks.   Continue with current pain management with tizanidine and I've added Diclofenac 50 mg twice daily as needed. I have provided a list of primary care providers that are accepting new patients please contact either office and schedule the next available appointment with any primary care provider that is accepting new patients.  For wheezing I prescribed albuterol inhaler 2 puffs every 4-6 hours as needed for wheezing, chest pain with breathing, or chest tightness.

## 2020-04-04 NOTE — ED Triage Notes (Signed)
Pt presents for follow-up on 3/8 with right side / rib pain under right breast. States completed prednisone and feels that congestion has subsided but pain is still there. Pain is worse with cough or sneeze.

## 2020-05-02 ENCOUNTER — Other Ambulatory Visit: Payer: Self-pay | Admitting: Family Medicine

## 2020-11-14 ENCOUNTER — Emergency Department (HOSPITAL_COMMUNITY)
Admission: EM | Admit: 2020-11-14 | Discharge: 2020-11-14 | Disposition: A | Discharge status: Home / Self Care | Payer: BC Managed Care – PPO | Attending: Emergency Medicine | Admitting: Emergency Medicine

## 2020-11-14 ENCOUNTER — Encounter (HOSPITAL_COMMUNITY): Payer: Self-pay | Admitting: Emergency Medicine

## 2020-11-14 ENCOUNTER — Emergency Department (HOSPITAL_COMMUNITY): Payer: BC Managed Care – PPO

## 2020-11-14 DIAGNOSIS — Y92410 Unspecified street and highway as the place of occurrence of the external cause: Secondary | ICD-10-CM | POA: Diagnosis not present

## 2020-11-14 DIAGNOSIS — S20219A Contusion of unspecified front wall of thorax, initial encounter: Secondary | ICD-10-CM | POA: Insufficient documentation

## 2020-11-14 DIAGNOSIS — Z79899 Other long term (current) drug therapy: Secondary | ICD-10-CM | POA: Insufficient documentation

## 2020-11-14 DIAGNOSIS — S29001A Unspecified injury of muscle and tendon of front wall of thorax, initial encounter: Secondary | ICD-10-CM | POA: Diagnosis present

## 2020-11-14 MED ORDER — CYCLOBENZAPRINE HCL 10 MG PO TABS: 10.0000 mg | ORAL_TABLET | Freq: Two times a day (BID) | ORAL | 0 refills | Status: AC | PRN

## 2020-11-14 MED ORDER — NAPROXEN 375 MG PO TABS: 375.0000 mg | ORAL_TABLET | Freq: Two times a day (BID) | ORAL | 0 refills | Status: AC

## 2020-11-14 MED ORDER — CYCLOBENZAPRINE HCL 10 MG PO TABS: 10.0000 mg | ORAL_TABLET | Freq: Two times a day (BID) | ORAL | 0 refills | Status: DC | PRN

## 2020-11-14 MED ORDER — NAPROXEN 375 MG PO TABS: 375.0000 mg | ORAL_TABLET | Freq: Two times a day (BID) | ORAL | 0 refills | Status: DC
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
## 2020-11-14 NOTE — ED Notes (Signed)
Pt verbalized understanding of d/c instructions, meds and followup care. Denies questions. VSS, no distress noted. Steady gait to exit with all belongings.  ?

## 2020-11-14 NOTE — Discharge Instructions (Addendum)
Take the medications as needed for pain.  Expect to be stiff and sore for the next several days.  Return to the ER for difficulty breathing abdominal pain vomiting
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
## 2021-01-11 ENCOUNTER — Ambulatory Visit
Admission: RE | Admit: 2021-01-11 | Discharge: 2021-01-11 | Disposition: A | Discharge status: Home / Self Care | Payer: BC Managed Care – PPO | Source: Ambulatory Visit | Attending: Orthopedic Surgery | Admitting: Orthopedic Surgery

## 2021-01-11 ENCOUNTER — Other Ambulatory Visit: Payer: Self-pay

## 2021-01-11 ENCOUNTER — Other Ambulatory Visit: Payer: Self-pay | Admitting: Orthopedic Surgery

## 2021-01-11 DIAGNOSIS — S139XXA Sprain of joints and ligaments of unspecified parts of neck, initial encounter: Secondary | ICD-10-CM

## 2021-01-11 DIAGNOSIS — S335XXA Sprain of ligaments of lumbar spine, initial encounter: Secondary | ICD-10-CM

## 2022-10-10 IMAGING — DX DG CHEST 2V
2 series · 2 of 2 positions shown · non-contrast
Comparison: December 05, 2008.

CLINICAL DATA: DJ32M-XA positive.  Shortness of breath.

EXAM:
CHEST - 2 VIEW

[chest pa]
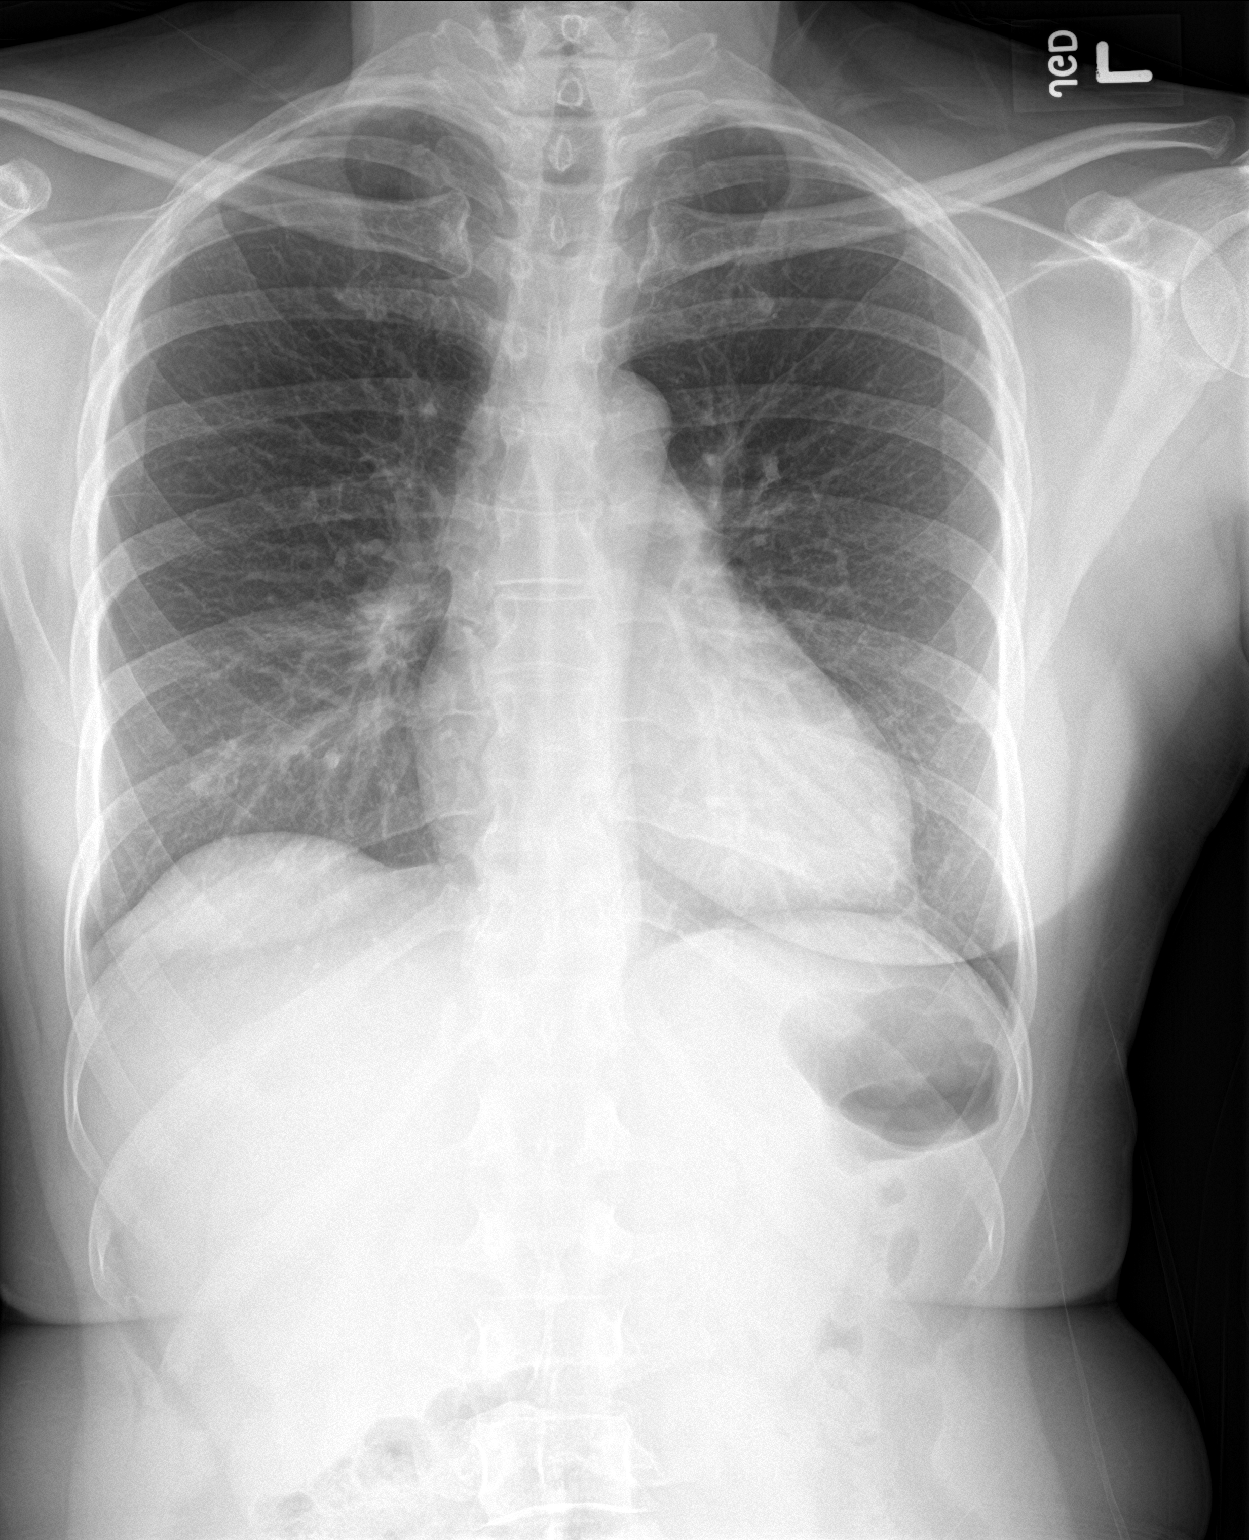

[chest lat]
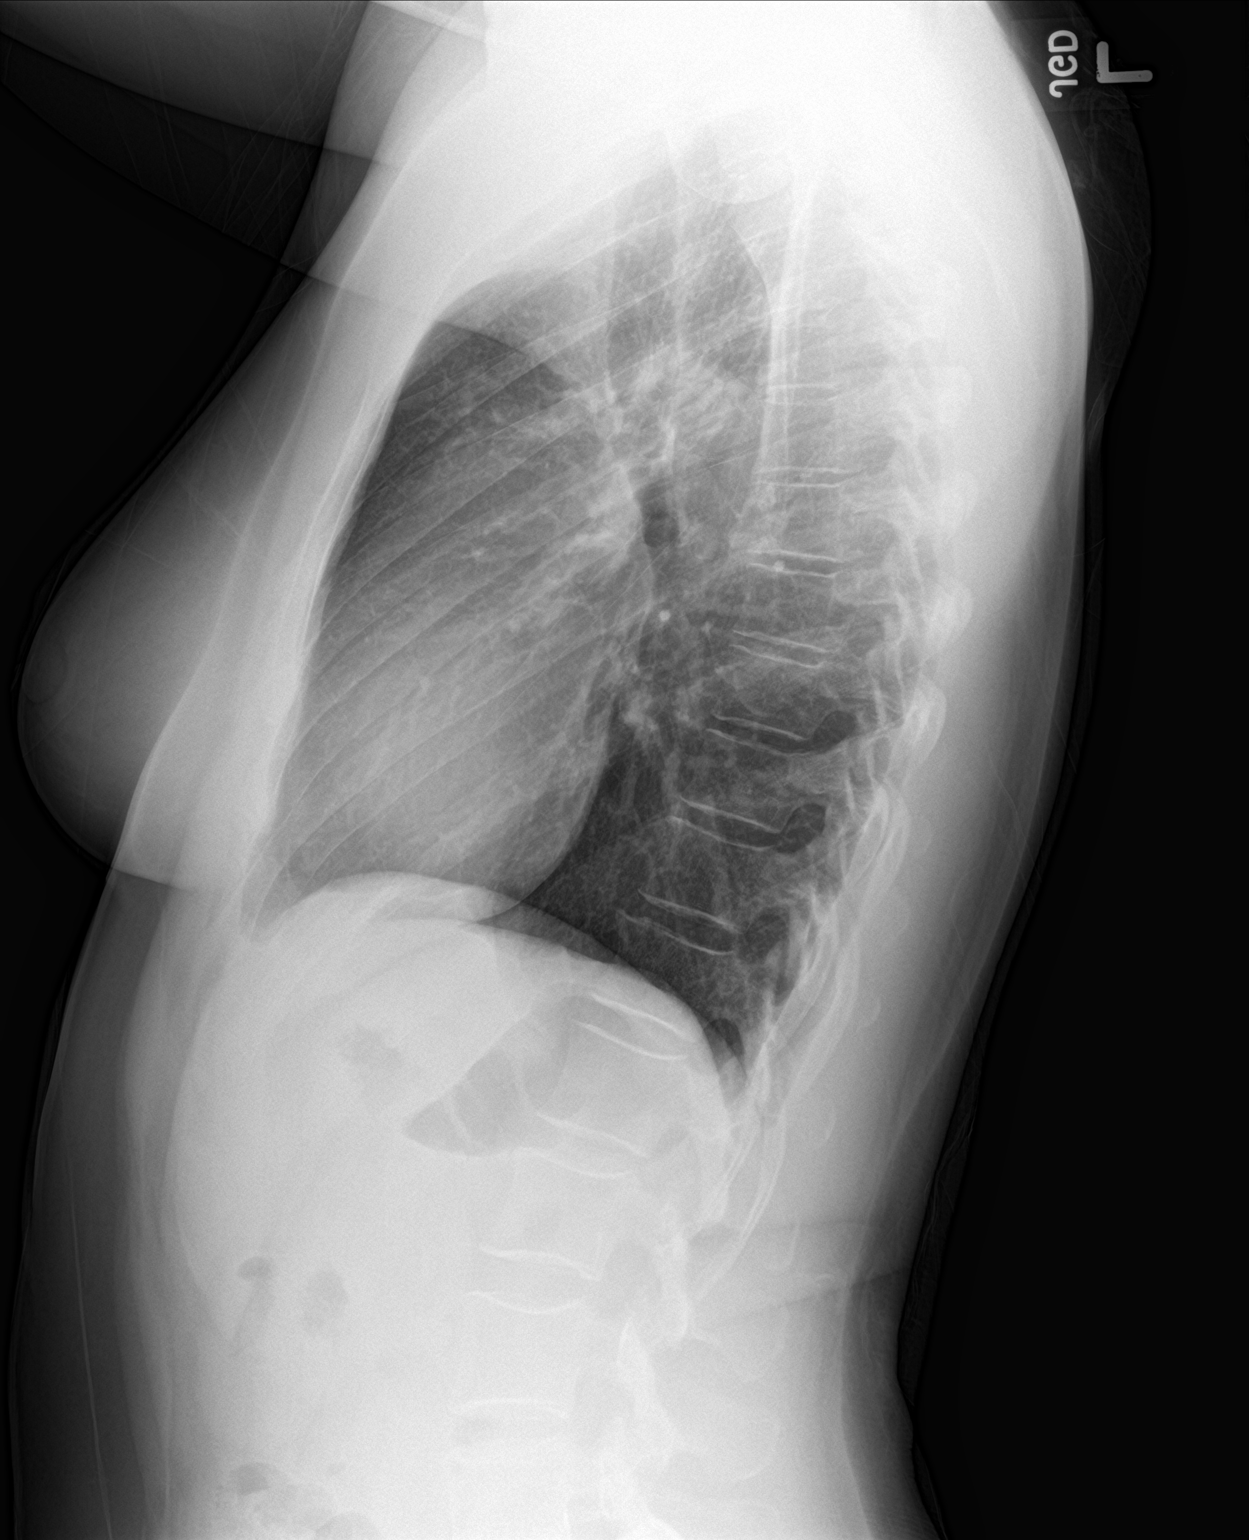

[2 of 2 positions shown; findings below may reference images not displayed]

FINDINGS: The heart size and mediastinal contours are within normal limits.
Both lungs are clear. No pneumothorax or pleural effusion is noted.
The visualized skeletal structures are unremarkable.
IMPRESSION: No active cardiopulmonary disease.
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
